# Patient Record
Sex: Male | Born: 1975 | Race: White | Hispanic: No | Smoking: Former smoker
Health system: Southern US, Community
[De-identification: ages and names within clinical notes are randomized; demographics above are authoritative.]

## PROBLEM LIST (undated history)

## (undated) DIAGNOSIS — R3911 Hesitancy of micturition: Secondary | ICD-10-CM

## (undated) DIAGNOSIS — F4310 Post-traumatic stress disorder, unspecified: Secondary | ICD-10-CM

## (undated) DIAGNOSIS — S43006A Unspecified dislocation of unspecified shoulder joint, initial encounter: Secondary | ICD-10-CM

## (undated) HISTORY — PX: APPENDECTOMY: SHX54

## (undated) HISTORY — PX: THUMB ARTHROSCOPY: SHX2509

## (undated) HISTORY — PX: TONSILLECTOMY: SUR1361

## (undated) HISTORY — PX: SHOULDER SURGERY: SHX246

---

## 2014-10-06 ENCOUNTER — Emergency Department (HOSPITAL_COMMUNITY)

## 2014-10-06 ENCOUNTER — Encounter (HOSPITAL_COMMUNITY): Payer: Self-pay | Admitting: Emergency Medicine

## 2014-10-06 ENCOUNTER — Emergency Department (HOSPITAL_COMMUNITY)
Admission: EM | Admit: 2014-10-06 | Discharge: 2014-10-07 | Disposition: A | Discharge status: Home / Self Care | Attending: Emergency Medicine | Admitting: Emergency Medicine

## 2014-10-06 DIAGNOSIS — Y9289 Other specified places as the place of occurrence of the external cause: Secondary | ICD-10-CM | POA: Diagnosis not present

## 2014-10-06 DIAGNOSIS — Z87891 Personal history of nicotine dependence: Secondary | ICD-10-CM | POA: Insufficient documentation

## 2014-10-06 DIAGNOSIS — Y998 Other external cause status: Secondary | ICD-10-CM | POA: Insufficient documentation

## 2014-10-06 DIAGNOSIS — Z91040 Latex allergy status: Secondary | ICD-10-CM | POA: Insufficient documentation

## 2014-10-06 DIAGNOSIS — S43004A Unspecified dislocation of right shoulder joint, initial encounter: Secondary | ICD-10-CM

## 2014-10-06 DIAGNOSIS — Y9389 Activity, other specified: Secondary | ICD-10-CM | POA: Diagnosis not present

## 2014-10-06 DIAGNOSIS — X58XXXA Exposure to other specified factors, initial encounter: Secondary | ICD-10-CM | POA: Insufficient documentation

## 2014-10-06 HISTORY — DX: Unspecified dislocation of unspecified shoulder joint, initial encounter: S43.006A

## 2014-10-06 MED ORDER — METHOCARBAMOL 500 MG PO TABS
1000.0000 mg | ORAL_TABLET | Freq: Once | ORAL | Status: DC
  Filled 2014-10-06: qty 2

## 2014-10-06 MED ORDER — ONDANSETRON HCL 4 MG/2ML IJ SOLN
4.0000 mg | Freq: Once | INTRAMUSCULAR | Status: AC
  Administered 2014-10-06: 4 mg via INTRAVENOUS
  Filled 2014-10-06: qty 2

## 2014-10-06 MED ORDER — HYDROCODONE-ACETAMINOPHEN 5-325 MG PO TABS
2.0000 | ORAL_TABLET | Freq: Once | ORAL | Status: DC
  Filled 2014-10-06: qty 2

## 2014-10-06 MED ORDER — KETAMINE HCL 10 MG/ML IJ SOLN
1.0000 mg/kg | Freq: Once | INTRAMUSCULAR | Status: DC
Start: 2014-10-06 — End: 2014-10-06
  Filled 2014-10-06: qty 1

## 2014-10-06 MED ORDER — LORAZEPAM 2 MG/ML IJ SOLN
1.0000 mg | Freq: Once | INTRAMUSCULAR | Status: AC
  Administered 2014-10-06: 1 mg via INTRAVENOUS

## 2014-10-06 MED ORDER — LORAZEPAM 2 MG/ML IJ SOLN
INTRAMUSCULAR | Status: AC
  Filled 2014-10-06: qty 1

## 2014-10-06 MED ORDER — HYDROMORPHONE HCL 1 MG/ML IJ SOLN
1.0000 mg | Freq: Once | INTRAMUSCULAR | Status: AC
  Administered 2014-10-06: 1 mg via INTRAVENOUS
  Filled 2014-10-06: qty 1

## 2014-10-06 NOTE — ED Notes (Signed)
Patient reports "my shoulder is out of place." states he went to push himself up and he felt shoulder right shoulder pop out. Reports history of right shoulder surgery and shoulder dislocation.

## 2014-10-06 NOTE — ED Provider Notes (Signed)
CSN: [id number]     Arrival date & time 10/06/14  1916 History   First MD Initiated Contact with Patient 10/06/14 2049     Chief Complaint  Patient presents with  . Shoulder Pain     (Consider location/radiation/quality/duration/timing/severity/associated sxs/prior Treatment) Patient is a 39 y.o. male presenting with shoulder pain. The history is provided by the patient.  Shoulder Pain Location:  Shoulder Time since incident: just prior to ED admission. Upper extremity injury: Hx of shoulder surgery. Pushed back on the bed and felt a pop as if shoulder came out of place.   Shoulder location:  R shoulder Pain details:    Quality:  Throbbing   Severity:  Severe   Onset quality:  Sudden   Timing:  Constant   Progression:  Worsening Chronicity:  Chronic Dislocation: yes   Relieved by:  Nothing Worsened by:  Movement Risk factors comment:  Similar situations  in the past.   Past Medical History  Diagnosis Date  . Shoulder dislocation    Past Surgical History  Procedure Laterality Date  . Shoulder surgery Right    History reviewed. No pertinent family history. History  Substance Use Topics  . Smoking status: Former Games developer  . Smokeless tobacco: Not on file  . Alcohol Use: No    Review of Systems  Musculoskeletal: Positive for arthralgias.  All other systems reviewed and are negative.     Allergies  Propofol; Latex; and Versed  Home Medications   Prior to Admission medications   Medication Sig Start Date End Date Taking? Authorizing Provider  ibuprofen (ADVIL,MOTRIN) 200 MG tablet Take 200 mg by mouth every 6 (six) hours as needed for mild pain or moderate pain.   Yes Historical Provider, MD   BP 124/79 mmHg  Pulse 74  Temp(Src) 97.8 F (36.6 C) (Oral)  Resp 20  Ht  (1.702 m)  Wt 166 lb (75.297 kg)  BMI 25.99 kg/m2  SpO2 97% Physical Exam  Constitutional: He is oriented to person, place, and time. He appears well-developed and well-nourished.   Non-toxic appearance.  HENT:  Head: Normocephalic.  Right Ear: Tympanic membrane and external ear normal.  Left Ear: Tympanic membrane and external ear normal.  Eyes: EOM and lids are normal. Pupils are equal, round, and reactive to light.  Neck: Normal range of motion. Neck supple. Carotid bruit is not present.  Cardiovascular: Normal rate, regular rhythm, normal heart sounds, intact distal pulses and normal pulses.   Pulmonary/Chest: Breath sounds normal. No respiratory distress.  Abdominal: Soft. Bowel sounds are normal. There is no tenderness. There is no guarding.  Musculoskeletal: Normal range of motion. He exhibits tenderness.  There is deformity consistent with dislocation of the right shoulder. Brachial and radial pulse 2+. FROM of the right wrist and fingers.  Lymphadenopathy:       Head (right side): No submandibular adenopathy present.       Head (left side): No submandibular adenopathy present.    He has no cervical adenopathy.  Neurological: He is alert and oriented to person, place, and time. He has normal strength. No cranial nerve deficit or sensory deficit.  Skin: Skin is warm and dry.  Psychiatric: He has a normal mood and affect. His speech is normal.  Nursing note and vitals reviewed.   ED Cours  Pt seen with me by Dr Manus Gunning.  Reduction of dislocation by Dr Manus Gunning.  Procedures (including critical care time) Labs Review Labs Reviewed - No data to display  Imaging Review  Dg Shoulder Right  10/06/2014   CLINICAL DATA:  Injured right shoulder tonight. History of prior dislocations.  EXAM: RIGHT SHOULDER - 2+ VIEW  COMPARISON:  None.  FINDINGS: There are surgical changes involving the glenoid. The [facility] joint and glenohumeral joints are maintained. No fracture or dislocation. The scapular Y-views are over rotated and are not diagnostic. The ribs are grossly normal.  IMPRESSION: Limited examination because of over rotated scapular Y-views. No evidence of dislocation on  the AP view.   Electronically Signed   By: Rudie Meyer M.D.   On: 10/06/2014 20:47     EKG Interpretation None      MDM Initial evaluation raised concerns about dislocation. Shoulder reduced, but came back out. Shoulder reduced a 2nd time. Xray reveal a better position. No fx, and possible chronic glenoid defect. Pt placed in sling. He will see orthopedic MD at [facility] in [place]. Pt released to prison officer.   Final diagnoses:  Shoulder dislocation, right, initial encounter    **I have reviewed nursing notes, vital signs, and all appropriate lab and imaging results for this patient.Ivery Quale, PA-C 10/07/14 0009  Glynn Octave, MD 10/07/14 [phone]

## 2014-10-06 NOTE — Discharge Instructions (Signed)
Your repeat x-ray reveals better positioning of your shoulder. It also shows some chronic changes of your ball and socket involving the right shoulder. It is important that you see the orthopedist to dig her last surgery, or the orthopedist of your choice for additional evaluation and management of this issue. Please use the sling for the next 7 days. Shoulder Dislocation  Shoulder dislocation is when your upper arm bone (humerus) is forced out of your shoulder joint. Your doctor will put your shoulder back into the joint by pulling on your arm or through surgery. Your arm will be placed in a shoulder immobilizer or sling. The shoulder immobilizer or sling holds your shoulder in place while it heals. HOME CARE   Rest your injured joint. Do not move it until instructed to do so.  Put ice on your injured joint as told by your doctor.  Put ice in a plastic bag.  Place a towel between your skin and the bag.  Leave the ice on for 15-20 minutes at a time, every 2 hours while you are awake.  Only take medicines as told by your doctor.  Squeeze a ball to exercise your hand. GET HELP RIGHT AWAY IF:   Your splint or sling becomes damaged.  Your pain becomes worse, not better.  You lose feeling in your arm or hand.  Your arm or hand becomes white or cold. MAKE SURE YOU:   Understand these instructions.  Will watch your condition.  Will get help right away if you are not doing well or get worse. Document Released: 05/10/2011 Document Reviewed: 05/10/2011 [facility] Patient Information 2015 [place], [place]. This information is not intended to replace advice given to you by your health care provider. Make sure you discuss any questions you have with your health care provider.

## 2014-10-07 NOTE — ED Provider Notes (Addendum)
Reduction of dislocation Date/Time: 21:00 Performed by: Glynn Octave Authorized by: Glynn Octave Consent: Verbal consent obtained. Risks and benefits: risks, benefits and alternatives were discussed Consent given by: patient Required items: required blood products, implants, devices, and special equipment available Time out: Immediately prior to procedure a "time out" was called to verify the correct patient, procedure, equipment, support staff and site/side marked as required.  Patient sedated: no.  Anxiolysis with dilaudid and ativan.  Refuses ketamine.   Vitals: Vital signs were monitored during sedation. Patient tolerance: Patient tolerated the procedure well with no immediate complications. Joint: R shoulder Reduction technique: external rotation, abduction, manipulation of humeral head    Glynn Octave, MD 10/07/14 [id number]  Glynn Octave, MD 10/07/14 2952

## 2014-10-07 NOTE — ED Notes (Signed)
Patient and officer verbalize understanding of discharge instructions, home care and follow up care. Patient ambulatory out of department at this time in police custody to return to jail

## 2014-11-13 ENCOUNTER — Emergency Department (HOSPITAL_COMMUNITY)
Admission: EM | Admit: 2014-11-13 | Discharge: 2014-11-14 | Disposition: A | Discharge status: Home / Self Care | Attending: Emergency Medicine | Admitting: Emergency Medicine

## 2014-11-13 ENCOUNTER — Encounter (HOSPITAL_COMMUNITY): Payer: Self-pay | Admitting: Emergency Medicine

## 2014-11-13 DIAGNOSIS — M7981 Nontraumatic hematoma of soft tissue: Secondary | ICD-10-CM | POA: Diagnosis not present

## 2014-11-13 DIAGNOSIS — Z9889 Other specified postprocedural states: Secondary | ICD-10-CM | POA: Insufficient documentation

## 2014-11-13 DIAGNOSIS — R51 Headache: Secondary | ICD-10-CM | POA: Diagnosis not present

## 2014-11-13 DIAGNOSIS — R55 Syncope and collapse: Secondary | ICD-10-CM | POA: Diagnosis not present

## 2014-11-13 DIAGNOSIS — R200 Anesthesia of skin: Secondary | ICD-10-CM | POA: Insufficient documentation

## 2014-11-13 DIAGNOSIS — Z91040 Latex allergy status: Secondary | ICD-10-CM | POA: Insufficient documentation

## 2014-11-13 DIAGNOSIS — M24411 Recurrent dislocation, right shoulder: Secondary | ICD-10-CM | POA: Insufficient documentation

## 2014-11-13 DIAGNOSIS — M25511 Pain in right shoulder: Secondary | ICD-10-CM | POA: Diagnosis present

## 2014-11-13 MED ORDER — LIDOCAINE HCL (PF) 1 % IJ SOLN
30.0000 mL | Freq: Once | INTRAMUSCULAR | Status: DC
  Filled 2014-11-13: qty 30

## 2014-11-13 NOTE — ED Notes (Addendum)
Pt states he heard a loud pop in right shoulder and then has syncopal episode with head striking a wall.

## 2014-11-13 NOTE — ED Notes (Signed)
Patient's handcuff and leg restraints removed, Public relations account executive at bedside.

## 2014-11-13 NOTE — ED Provider Notes (Signed)
CSN: [id number]     Arrival date & time 11/13/14  2324 History  his chart was scribed for Shon Baton, MD by Murriel Hopper, ED Scribe. This patient was seen in room APA03/[facility] and the patient's care was started at 11:54 PM.  Chief Complaint  Patient presents with  . Loss of Consciousness      The history is provided by the patient. No language interpreter was used.   HPI Comments: Paul Dawson is a 39 y.o. male who presents to the Emergency Department complaining of constant, worsening 8/10 right shoulder pain with associated numbness, syncopal episode and headache that has been present since earlier today. Pt states he heard his shoulder pop and then passed out. He states the next thing he remembers were correctional officers picking him up from off the ground. Pt states he hit his head on the wall when he had syncopal episode and now complains of a generalized headache and nausea. No vomiting.  Pt states he has dislocated the same shoulder twice in the past 3 months, and has a hx of 3 previous shoulder surgeries. History of recurrent shoulder dislocations. Pt states he has numbness radiating down the underside of his right forearm into his palm. Pt states he has taken Ibuprofen to treat his pain with little relief. Pt denies neck pain, and states his last tetanus shot was 2.5 years ago.   Of note, per correctional officers, patient frequently "throws shoulder out."    Past Medical History  Diagnosis Date  . Shoulder dislocation    Past Surgical History  Procedure Laterality Date  . Shoulder surgery Right   . Appendectomy    . Tonsillectomy    . Thumb arthroscopy     No family history on file. Social History  Substance Use Topics  . Smoking status: Former Games developer  . Smokeless tobacco: None  . Alcohol Use: No    Review of Systems  Constitutional: Negative.  Negative for fever.  Respiratory: Negative.  Negative for chest tightness and shortness of breath.    Cardiovascular: Negative.  Negative for chest pain.  Gastrointestinal: Negative.   Genitourinary: Negative.  Negative for dysuria.  Musculoskeletal: Negative for back pain.       Shoulder pain  Neurological: Positive for syncope, numbness and headaches. Negative for dizziness, weakness and light-headedness.  All other systems reviewed and are negative.     Allergies  Propofol; Latex; and Versed  Home Medications   Prior to Admission medications   Medication Sig Start Date End Date Taking? Authorizing Provider  ibuprofen (ADVIL,MOTRIN) 200 MG tablet Take 200 mg by mouth every 6 (six) hours as needed for mild pain or moderate pain.    Historical Provider, MD   BP 110/85 mmHg  Pulse 68  Temp(Src) 97.5 F (36.4 C) (Oral)  Resp 20  Ht 5\' 7"  (1.702 m)  Wt 160 lb (72.576 kg)  BMI 25.05 kg/m2  SpO2 99% Physical Exam  Constitutional: He is oriented to person, place, and time. He appears well-developed and well-nourished. No distress.  HENT:  Head: Normocephalic.  2 cm hematoma noted over the left parietal scalp, small overlying abrasion  Eyes: Pupils are equal, round, and reactive to light.  Neck: Normal range of motion. Neck supple.  No midline step-off or deformity  Cardiovascular: Normal rate, regular rhythm and normal heart sounds.   No murmur heard. Pulmonary/Chest: Effort normal and breath sounds normal. No respiratory distress. He has no wheezes.  Abdominal: Soft. Bowel sounds are normal.  There is no tenderness. There is no rebound.  Musculoskeletal: He exhibits no edema.  Limited range of motion, shoulder appears anterior dislocated with taken glenoid fossa, neurovascularly intact distally with a 2+ radial pulse and intact median, ulnar, and radial nerves  Lymphadenopathy:    He has no cervical adenopathy.  Neurological: He is alert and oriented to person, place, and time.  Skin: Skin is warm and dry.  Psychiatric: He has a normal mood and affect.  Nursing note and  vitals reviewed.   ED Course  Procedures (including critical care time)  Reduction of dislocation Date/Time: 2:28 AM Performed by: Shon Baton Authorized by: Shon Baton Consent: Verbal consent obtained. Risks and benefits: risks, benefits and alternatives were discussed Consent given by: patient Required items: required blood products, implants, devices, and special equipment available Time out: Immediately prior to procedure a "time out" was called to verify the correct patient, procedure, equipment, support staff and site/side marked as required.  Patient sedated: no; given morphine for anxiolysis and Ativan for possible subluxation  Vitals: Vital signs were monitored during sedation. Patient tolerance: Patient tolerated the procedure well with no immediate complications. Joint: Shoulder Reduction technique: External rotation    DIAGNOSTIC STUDIES: Oxygen Saturation is 100% on room air, normal by my interpretation.    COORDINATION OF CARE: 12:01 AM Discussed treatment plan with pt at bedside and pt agreed to plan.   Labs Review Labs Reviewed - No data to display  Imaging Review Dg Shoulder Right  11/14/2014   CLINICAL DATA:  Postreduction.  EXAM: RIGHT SHOULDER - 2+ VIEW  COMPARISON:  Earlier this day.  Radiographs 10/06/2014  FINDINGS: Humeral head normally persistent over the glenoid on the transscapular Y-view. Two surgical screws and single anchor seen in the inferior glenoid. Some adjacent fragmentation appears chronic. Acromioclavicular joint is maintained.  IMPRESSION: Normal glenohumeral alignment, best appreciated on transscapular Y-view. Postsurgical change of the inferior glenoid with adjacent chronic fragmentation.   Electronically Signed   By: Rubye Oaks M.D.   On: 11/14/2014 02:10   Dg Shoulder Right  11/14/2014   CLINICAL DATA:  39 year old male with fall and right shoulder injury.  EXAM: RIGHT SHOULDER - 2+ VIEW  COMPARISON:  Radiograph  dated 10/06/2014  FINDINGS: No acute fracture or dislocation. There is stable postoperative changes of the glenoid with fixation screws. The soft tissues are grossly unremarkable.  IMPRESSION: No acute fracture or dislocation.   Electronically Signed   By: Elgie Collard M.D.   On: 11/14/2014 00:52   Ct Head Wo Contrast  11/14/2014   CLINICAL DATA:  39 year old male with fall and head injury  EXAM: CT HEAD WITHOUT CONTRAST  TECHNIQUE: Contiguous axial images were obtained from the base of the skull through the vertex without intravenous contrast.  COMPARISON:  None.  FINDINGS: The ventricles and the sulci are appropriate in size for the patient's age. There is no intracranial hemorrhage. No midline shift or mass effect identified. The gray-white matter differentiation is preserved.  The visualized paranasal sinuses and mastoid air cells are well aerated. The calvarium is intact.  IMPRESSION: No acute intracranial pathology.   Electronically Signed   By: Elgie Collard M.D.   On: 11/14/2014 00:56   I have personally reviewed and evaluated these images and lab results as part of my medical decision-making.   EKG Interpretation None      MDM   Final diagnoses:  Recurrent shoulder dislocation, right    Patient presents with what appears to  be a right shoulder dislocation. History of the same. Recurrent dislocations in the past. Also reports syncopal event secondary to pain. He sustained a minor head injury. He has evidence of small hematoma and abrasion to the forehead and reports nausea. For this reason CT head was obtained. Plain films of the shoulder showed that the shoulder appears in place. However, clinically, the humeral head is anteriorly located with vacant fossa. Suspect subluxation versus dislocation.  Shoulder was reduced at the bedside with clinical improvement of exam. Patient's neurovascular exam remained normal postreduction. Repeat films reassuring. Patient placed in a sling.  Patient to follow-up with his orthopedist as soon as possible CT head negative.   After history, exam, and medical workup I feel the patient has been appropriately medically screened and is safe for discharge home. Pertinent diagnoses were discussed with the patient. Patient was given return precautions.  I personally performed the services described in this documentation, which was scribed in my presence. The recorded information has been reviewed and is accurate.    Shon Baton, MD 11/14/14 [phone]

## 2014-11-14 ENCOUNTER — Emergency Department (HOSPITAL_COMMUNITY)

## 2014-11-14 MED ORDER — LORAZEPAM 2 MG/ML IJ SOLN
1.0000 mg | Freq: Once | INTRAMUSCULAR | Status: AC
  Administered 2014-11-14: 1 mg via INTRAVENOUS
  Filled 2014-11-14: qty 1

## 2014-11-14 MED ORDER — BACITRACIN ZINC 500 UNIT/GM EX OINT
TOPICAL_OINTMENT | CUTANEOUS | Status: AC
  Filled 2014-11-14: qty 0.9

## 2014-11-14 MED ORDER — LIDOCAINE HCL (PF) 2 % IJ SOLN
INTRAMUSCULAR | Status: AC
  Filled 2014-11-14: qty 10

## 2014-11-14 MED ORDER — MORPHINE SULFATE (PF) 4 MG/ML IV SOLN
4.0000 mg | Freq: Once | INTRAVENOUS | Status: AC
  Administered 2014-11-14: 4 mg via INTRAVENOUS
  Filled 2014-11-14: qty 1

## 2014-11-14 NOTE — ED Notes (Signed)
Gave report to Physician in [place], [place] and he stated it was okay to sent to discharge patient.  Patient placed in right shoulder sling, vital signs stable, in custody of Officer Staples, who has discharge paperwork.

## 2014-11-14 NOTE — Discharge Instructions (Signed)
You were seen today for what was likely a shoulder subluxation. You have a history of recurrent shoulder dislocations. You need to follow-up closely with your orthopedist. Maintain splint until follow-up.  Shoulder Dislocation Your shoulder is made up of three bones: the collar bone (clavicle); the shoulder blade (scapula), which includes the socket (glenoid cavity); and the upper arm bone (humerus). Your shoulder joint is the place where these bones meet. Strong, fibrous tissues hold these bones together (ligaments). Muscles and strong, fibrous tissues that connect the muscles to these bones (tendons) allow your arm to move through this joint. The range of motion of your shoulder joint is more extensive than most of your other joints, and the glenoid cavity is very shallow. That is the reason that your shoulder joint is one of the most unstable joints in your body. It is far more prone to dislocation than your other joints. Shoulder dislocation is when your humerus is forced out of your shoulder joint. CAUSES Shoulder dislocation is caused by a forceful impact on your shoulder. This impact usually is from an injury, such as a sports injury or a fall. SYMPTOMS Symptoms of shoulder dislocation include:  Deformity of your shoulder.  Intense pain.  Inability to move your shoulder joint.  Numbness, weakness, or tingling around your shoulder joint (your neck or down your arm).  Bruising or swelling around your shoulder. DIAGNOSIS In order to diagnose a dislocated shoulder, your caregiver will perform a physical exam. Your caregiver also may have an X-ray exam done to see if you have any broken bones. Magnetic resonance imaging (MRI) is a procedure that sometimes is done to help your caregiver see any damage to the soft tissues around your shoulder, particularly your rotator cuff tendons. Additionally, your caregiver also may have electromyography done to measure the electrical discharges produced in  your muscles if you have signs or symptoms of nerve damage. TREATMENT A shoulder dislocation is treated by placing the humerus back in the joint (reduction). Your caregiver does this either manually (closed reduction), by moving your humerus back into the joint through manipulation, or through surgery (open reduction). When your humerus is back in place, severe pain should improve almost immediately. You also may need to have surgery if you have a weak shoulder joint or ligaments, and you have recurring shoulder dislocations, despite rehabilitation. In rare cases, surgery is necessary if your nerves or blood vessels are damaged during the dislocation. After your reduction, your arm will be placed in a shoulder immobilizer or sling to keep it from moving. Your caregiver will have you wear your shoulder immobilizer or sling for 3 days to 3 weeks, depending on how serious your dislocation is. When your shoulder immobilizer or sling is removed, your caregiver may prescribe physical therapy to help improve the range of motion in your shoulder joint. HOME CARE INSTRUCTIONS  The following measures can help to reduce pain and speed up the healing process:  Rest your injured joint. Do not move it. Avoid activities similar to the one that caused your injury.  Apply ice to your injured joint for the first day or two after your reduction or as directed by your caregiver. Applying ice helps to reduce inflammation and pain.  Put ice in a plastic bag.  Place a towel between your skin and the bag.  Leave the ice on for 15-20 minutes at a time, every 2 hours while you are awake.  Exercise your hand by squeezing a soft ball. This helps to  eliminate stiffness and swelling in your hand and wrist.  Take over-the-counter or prescription medicine for pain or discomfort as told by your caregiver. SEEK IMMEDIATE MEDICAL CARE IF:   Your shoulder immobilizer or sling becomes damaged.  Your pain becomes worse rather  than better.  You lose feeling in your arm or hand, or they become white and cold. MAKE SURE YOU:   Understand these instructions.  Will watch your condition.  Will get help right away if you are not doing well or get worse. Document Released: 11/10/2000 Document Revised: 07/02/2013 Document Reviewed: 12/06/2010 [facility] Patient Information 2015 [place], [place]. This information is not intended to replace advice given to you by your health care provider. Make sure you discuss any questions you have with your health care provider.

## 2015-04-09 ENCOUNTER — Encounter (HOSPITAL_COMMUNITY): Payer: Self-pay | Admitting: *Deleted

## 2015-04-09 ENCOUNTER — Emergency Department (HOSPITAL_COMMUNITY)

## 2015-04-09 ENCOUNTER — Emergency Department (HOSPITAL_COMMUNITY)
Admission: EM | Admit: 2015-04-09 | Discharge: 2015-04-09 | Disposition: A | Discharge status: Home / Self Care | Attending: Emergency Medicine | Admitting: Emergency Medicine

## 2015-04-09 DIAGNOSIS — S0993XA Unspecified injury of face, initial encounter: Secondary | ICD-10-CM | POA: Diagnosis not present

## 2015-04-09 DIAGNOSIS — S43004A Unspecified dislocation of right shoulder joint, initial encounter: Secondary | ICD-10-CM | POA: Insufficient documentation

## 2015-04-09 DIAGNOSIS — W2209XA Striking against other stationary object, initial encounter: Secondary | ICD-10-CM | POA: Insufficient documentation

## 2015-04-09 DIAGNOSIS — Y9389 Activity, other specified: Secondary | ICD-10-CM | POA: Insufficient documentation

## 2015-04-09 DIAGNOSIS — Y9289 Other specified places as the place of occurrence of the external cause: Secondary | ICD-10-CM | POA: Insufficient documentation

## 2015-04-09 DIAGNOSIS — S4991XA Unspecified injury of right shoulder and upper arm, initial encounter: Secondary | ICD-10-CM | POA: Diagnosis present

## 2015-04-09 DIAGNOSIS — Z91040 Latex allergy status: Secondary | ICD-10-CM | POA: Diagnosis not present

## 2015-04-09 DIAGNOSIS — Z87891 Personal history of nicotine dependence: Secondary | ICD-10-CM | POA: Insufficient documentation

## 2015-04-09 DIAGNOSIS — Y998 Other external cause status: Secondary | ICD-10-CM | POA: Diagnosis not present

## 2015-04-09 HISTORY — DX: Hesitancy of micturition: R39.11

## 2015-04-09 MED ORDER — HYDROMORPHONE HCL 1 MG/ML IJ SOLN
INTRAMUSCULAR | Status: DC
Start: 2015-04-09 — End: 2015-04-09
  Filled 2015-04-09: qty 1

## 2015-04-09 MED ORDER — LORAZEPAM 2 MG/ML IJ SOLN
1.0000 mg | Freq: Once | INTRAMUSCULAR | Status: AC
  Administered 2015-04-09: 1 mg via INTRAVENOUS
  Filled 2015-04-09: qty 1

## 2015-04-09 MED ORDER — HYDROCODONE-ACETAMINOPHEN 5-325 MG PO TABS: 1.0000 | ORAL_TABLET | Freq: Four times a day (QID) | ORAL | Status: DC | PRN

## 2015-04-09 MED ORDER — SODIUM CHLORIDE 0.9 % IV SOLN
INTRAVENOUS | Status: DC
  Administered 2015-04-09: 1000 mL via INTRAVENOUS

## 2015-04-09 MED ORDER — IBUPROFEN 800 MG PO TABS: 800.0000 mg | ORAL_TABLET | Freq: Three times a day (TID) | ORAL | Status: AC | PRN

## 2015-04-09 MED ORDER — HYDROMORPHONE HCL 1 MG/ML IJ SOLN
1.0000 mg | Freq: Once | INTRAMUSCULAR | Status: AC
  Administered 2015-04-09: 1 mg via INTRAVENOUS

## 2015-04-09 MED ORDER — HYDROMORPHONE HCL 1 MG/ML IJ SOLN
1.0000 mg | Freq: Once | INTRAMUSCULAR | Status: AC
  Administered 2015-04-09: 1 mg via INTRAVENOUS
  Filled 2015-04-09: qty 1

## 2015-04-09 NOTE — ED Notes (Signed)
Pt states he had his right arm twisted behind him tonight & thinks it is dislocated. Pt says he has a bad shoulder anyway. Pt has good pulses & cap refill.
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
## 2015-04-09 NOTE — Discharge Instructions (Signed)
How to Use a Shoulder Immobilizer °A shoulder immobilizer is a device that you may have to wear after a shoulder injury or surgery. This device keeps your arm from moving. This prevents additional pain or injury. It also supports your arm next to your body as your shoulder heals. °You may need to wear a shoulder immobilizer to treat a broken bone (fracture) in your shoulder. You may also need to wear one if you have an injury that moves your shoulder out of position (dislocation). There are different types of shoulder immobilizers. The one that you get depends on your injury. °RISKS AND COMPLICATIONS °Wearing a shoulder immobilizer in the wrong way can let your injured shoulder move around too much. This may delay healing and make your pain and swelling worse. °HOW TO USE YOUR SHOULDER IMMOBILIZER °· The part of the immobilizer that goes around your neck (sling) should support your upper arm, with your elbow bent and your lower arm and hand across your chest. °· Make sure that your elbow: °· Is snug against the back pocket of the sling. °· Does not move away from your body. °· The strap of the immobilizer should go over your shoulder and support your arm and hand. Your hand should be slightly higher than your elbow. It should not hang loosely over the edge of the sling. °· If the long strap has a pad, place it where it is most comfortable on your neck. °· Carefully follow your health care provider's instructions for wearing your shoulder immobilizer. Your health care provider may want you to: °· Loosen your immobilizer to straighten your elbow and move your wrist and fingers. You may have to do this several times each day. Ask your health care provider when you should do this and how often. °· Remove your immobilizer once every day to shower, but limit the movement in your injured arm. Before putting the immobilizer back on, use a towel to dry the area under your arm completely. °· Remove your immobilizer to do  shoulder exercises at home as directed by your health care provider. °· Wear your immobilizer while you sleep. You may sleep more comfortably if you have your upper body raised on pillows. °SEEK MEDICAL CARE IF: °· Your immobilizer is not supporting your arm properly. °· Your immobilizer gets damaged. °· You have worsening pain or swelling in your shoulder, arm, or hand. °· Your shoulder, arm, or hand changes color or temperature. °· You lose feeling in your shoulder, arm, or hand. °  °This information is not intended to replace advice given to you by your health care provider. Make sure you discuss any questions you have with your health care provider. °  °Document Released: 03/25/2004 Document Revised: 07/02/2014 Document Reviewed: 01/23/2014 °Elsevier Interactive Patient Education ©2016 Elsevier Inc. ° °Shoulder Dislocation °A shoulder dislocation happens when the upper arm bone (humerus) moves out of the shoulder joint. The shoulder joint is the part of the shoulder where the humerus, shoulder blade (scapula), and collarbone (clavicle) meet. °CAUSES °This condition is often caused by: °· A fall. °· A hit to the shoulder. °· A forceful movement of the shoulder. °RISK FACTORS °This condition is more likely to develop in people who play sports. °SYMPTOMS °Symptoms of this condition include: °· Deformity of the shoulder. °· Intense pain. °· Inability to move the shoulder. °· Numbness, weakness, or tingling in your neck or down your arm. °· Bruising or swelling around your shoulder. °DIAGNOSIS °This condition is diagnosed with   a physical exam. After the exam, tests may be done to check for related problems. Tests that may be done include: °· X-ray. This may be done to check for broken bones. °· MRI. This may be done to check for damage to the tissues around the shoulder. °· Electromyogram. This may be done to check for nerve damage. °TREATMENT °This condition is treated with a procedure to place the humerus back in  the joint. This procedure is called a reduction. There are two types of reduction: °· Closed reduction. In this procedure, the humerus is placed back in the joint without surgery. The health care provider uses his or her hands to guide the bone back into place. °· Open reduction. In this procedure, the humerus is placed back in the joint with surgery. An open reduction may be recommended if: °¨ You have a weak shoulder joint or weak ligaments. °¨ You have had more than one shoulder dislocation. °¨ The nerves or blood vessels around your shoulder have been damaged. °After the humerus is placed back into the joint, your arm will be placed in a splint or sling to prevent it from moving. You will need to wear the splint or sling until your shoulder heals. When the splint or sling is removed, you may have physical therapy to help improve the range of motion in your shoulder joint. °HOME CARE INSTRUCTIONS °If You Have a Splint or Sling: °· Wear it as told by your health care provider. Remove it only as told by your health care provider. °· Loosen it if your fingers become numb and tingle, or if they turn cold and blue. °· Keep it clean and dry. °Bathing °· Do not take baths, swim, or use a hot tub until your health care provider approves. Ask your health care provider if you can take showers. You may only be allowed to take sponge baths for bathing. °· If your health care provider approves bathing and showering, cover your splint or sling with a watertight plastic bag to protect it from water. Do not let the splint or sling get wet. °Managing Pain, Stiffness, and Swelling °· If directed, apply ice to the injured area. °¨ Put ice in a plastic bag. °¨ Place a towel between your skin and the bag. °¨ Leave the ice on for 20 minutes, 2-3 times per day. °· Move your fingers often to avoid stiffness and to decrease swelling. °· Raise (elevate) the injured area above the level of your heart while you are sitting or lying  down. °Driving °· Do not drive while wearing a splint or sling on a hand that you use for driving. °· Do not drive or operate heavy machinery while taking pain medicine. °Activity °· Return to your normal activities as told by your health care provider. Ask your health care provider what activities are safe for you. °· Perform range-of-motion exercises only as told by your health care provider. °· Exercise your hand by squeezing a soft ball. This helps to decrease stiffness and swelling in your hand and wrist. °General Instructions °· Take over-the-counter and prescription medicines only as told by your health care provider. °· Do not use any tobacco products, including cigarettes, chewing tobacco, or e-cigarettes. Tobacco can delay bone and tissue healing. If you need help quitting, ask your health care provider. °· Keep all follow-up visits as told by your health care provider. This is important. °SEEK MEDICAL CARE IF: °· Your splint or sling gets damaged. °SEEK IMMEDIATE MEDICAL CARE   IF: °· Your pain gets worse rather than better. °· You lose feeling in your arm or hand. °· Your arm or hand becomes white and cold. °  °This information is not intended to replace advice given to you by your health care provider. Make sure you discuss any questions you have with your health care provider. °  °Document Released: 11/10/2000 Document Revised: 11/06/2014 Document Reviewed: 06/10/2014 °Elsevier Interactive Patient Education ©2016 Elsevier Inc. ° °
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
## 2015-06-01 ENCOUNTER — Emergency Department (HOSPITAL_COMMUNITY)
Admission: EM | Admit: 2015-06-01 | Discharge: 2015-06-02 | Disposition: A | Discharge status: Home / Self Care | Attending: Emergency Medicine | Admitting: Emergency Medicine

## 2015-06-01 ENCOUNTER — Emergency Department (HOSPITAL_COMMUNITY)

## 2015-06-01 ENCOUNTER — Encounter (HOSPITAL_COMMUNITY): Payer: Self-pay | Admitting: *Deleted

## 2015-06-01 DIAGNOSIS — Y999 Unspecified external cause status: Secondary | ICD-10-CM | POA: Insufficient documentation

## 2015-06-01 DIAGNOSIS — S43011A Anterior subluxation of right humerus, initial encounter: Secondary | ICD-10-CM | POA: Diagnosis not present

## 2015-06-01 DIAGNOSIS — Y929 Unspecified place or not applicable: Secondary | ICD-10-CM | POA: Diagnosis not present

## 2015-06-01 DIAGNOSIS — Y939 Activity, unspecified: Secondary | ICD-10-CM | POA: Insufficient documentation

## 2015-06-01 DIAGNOSIS — Z791 Long term (current) use of non-steroidal anti-inflammatories (NSAID): Secondary | ICD-10-CM | POA: Insufficient documentation

## 2015-06-01 DIAGNOSIS — S4991XA Unspecified injury of right shoulder and upper arm, initial encounter: Secondary | ICD-10-CM | POA: Diagnosis present

## 2015-06-01 DIAGNOSIS — Z87891 Personal history of nicotine dependence: Secondary | ICD-10-CM | POA: Diagnosis not present

## 2015-06-01 DIAGNOSIS — X58XXXA Exposure to other specified factors, initial encounter: Secondary | ICD-10-CM | POA: Insufficient documentation

## 2015-06-01 DIAGNOSIS — S43001A Unspecified subluxation of right shoulder joint, initial encounter: Secondary | ICD-10-CM

## 2015-06-01 HISTORY — DX: Post-traumatic stress disorder, unspecified: F43.10

## 2015-06-01 MED ORDER — HYDROMORPHONE HCL 1 MG/ML IJ SOLN
1.0000 mg | Freq: Once | INTRAMUSCULAR | Status: AC
  Administered 2015-06-01: 1 mg via INTRAVENOUS
  Filled 2015-06-01: qty 1

## 2015-06-01 MED ORDER — ONDANSETRON HCL 4 MG/2ML IJ SOLN
4.0000 mg | Freq: Once | INTRAMUSCULAR | Status: AC
  Administered 2015-06-01: 4 mg via INTRAVENOUS
  Filled 2015-06-01: qty 2

## 2015-06-01 MED ORDER — DIAZEPAM 5 MG/ML IJ SOLN
2.5000 mg | Freq: Once | INTRAMUSCULAR | Status: AC
  Administered 2015-06-01: 2.5 mg via INTRAVENOUS
  Filled 2015-06-01: qty 2

## 2015-06-01 NOTE — ED Notes (Signed)
[facility] He states he went to lay down in the bed and he reached back with his right arm. Pt states he has multiple dislocations and 3 surgeries to this shoulder. Pt concerned shoulder is dislocated.

## 2015-06-01 NOTE — ED Notes (Signed)
   06/01/15 2217  Musculoskeletal  Musculoskeletal (WDL) X  RUE Limited movement  Pt with pain to right shoulder. Hx of multiply dislocations. Good sensation & pulses present.

## 2015-06-02 ENCOUNTER — Emergency Department (HOSPITAL_COMMUNITY)

## 2015-06-02 MED ORDER — HYDROMORPHONE HCL 1 MG/ML IJ SOLN
1.0000 mg | Freq: Once | INTRAMUSCULAR | Status: DC
  Filled 2015-06-02: qty 1

## 2015-06-02 NOTE — Discharge Instructions (Signed)
Shoulder Dislocation Your shoulder joint is made up of 3 bones:  The upper arm bone (humerus).  The shoulder blade (scapula).  The collarbone (clavicle). A shoulder dislocation happens when your upper arm bone moves out of its normal place in your shoulder joint. HOME CARE If You Have a Splint or Sling:  Wear it as told by your doctor.  Take it off only as told by your doctor.  Loosen it if:  Your fingers become numb and tingly.  Your fingers turn cold and blue.  Keep it clean and dry. Bathing  Do not take baths, swim, or use a hot tub until your doctor says you can. Ask your doctor if you can take showers. You may only be allowed to take sponge baths.  If your doctor says taking baths or showers is okay, cover your splint or sling with a plastic bag. Do not let the splint or sling get wet. Managing Pain, Stiffness, and Swelling  If told, put ice on the injured area.  Put ice in a plastic bag.  Place a towel between your skin and the bag.  Leave the ice on for 20 minutes, 2-3 times per day.  Move your fingers often to avoid stiffness and to lessen swelling.  Raise (elevate) the injured area above the level of your heart while you are sitting or lying down. Driving  Do not drive while you are wearing a splint or sling on a hand that you use for driving.  Do not drive or operate heavy machinery while taking pain medicine. Activity  Return to your normal activities as told by your doctor. Ask your doctor what activities are safe for you.  Do range-of-motion exercises only as told by your doctor.  Exercise your hand by squeezing a soft ball. This keeps your hand and wrist from getting stiff and swollen. General Instructions  Take over-the-counter and prescription medicines only as told by your doctor.  Do not use any tobacco products, including cigarettes, chewing tobacco, or e-cigarettes. Tobacco can slow down healing. If you need help quitting, ask your  doctor.  Keep all follow-up visits as told by your doctor. This is important. GET HELP IF:  Your splint or sling gets damaged. GET HELP RIGHT AWAY IF:  Your pain gets worse instead of better.  You lose feeling in your arm or hand.  Your arm or hand turns white and cold.   This information is not intended to replace advice given to you by your health care provider. Make sure you discuss any questions you have with your health care provider.   Document Released: 05/10/2011 Document Revised: 11/06/2014 Document Reviewed: 06/10/2014 Elsevier Interactive Patient Education 2016 [employer].  How to Use a Shoulder Immobilizer A shoulder immobilizer is a device that you may have to wear after a shoulder injury or surgery. This device keeps your arm from moving. This prevents additional pain or injury. It also supports your arm next to your body as your shoulder heals. You may need to wear a shoulder immobilizer to treat a broken bone (fracture) in your shoulder. You may also need to wear one if you have an injury that moves your shoulder out of position (dislocation). There are different types of shoulder immobilizers. The one that you get depends on your injury. RISKS AND COMPLICATIONS Wearing a shoulder immobilizer in the wrong way can let your injured shoulder move around too much. This may delay healing and make your pain and swelling worse. HOW TO USE  YOUR SHOULDER IMMOBILIZER °· The part of the immobilizer that goes around your neck (sling) should support your upper arm, with your elbow bent and your lower arm and hand across your chest. °· Make sure that your elbow: °¨ Is snug against the back pocket of the sling. °¨ Does not move away from your body. °· The strap of the immobilizer should go over your shoulder and support your arm and hand. Your hand should be slightly higher than your elbow. It should not hang loosely over the edge of the sling. °· If the long strap has a pad, place it  where it is most comfortable on your neck. °· Carefully follow your health care provider's instructions for wearing your shoulder immobilizer. Your health care provider may want you to: °¨ Loosen your immobilizer to straighten your elbow and move your wrist and fingers. You may have to do this several times each day. Ask your health care provider when you should do this and how often. °¨ Remove your immobilizer once every day to shower, but limit the movement in your injured arm. Before putting the immobilizer back on, use a towel to dry the area under your arm completely. °¨ Remove your immobilizer to do shoulder exercises at home as directed by your health care provider. °¨ Wear your immobilizer while you sleep. You may sleep more comfortably if you have your upper body raised on pillows. °SEEK MEDICAL CARE IF: °· Your immobilizer is not supporting your arm properly. °· Your immobilizer gets damaged. °· You have worsening pain or swelling in your shoulder, arm, or hand. °· Your shoulder, arm, or hand changes color or temperature. °· You lose feeling in your shoulder, arm, or hand. °  °This information is not intended to replace advice given to you by your health care provider. Make sure you discuss any questions you have with your health care provider. °  °Document Released: 03/25/2004 Document Revised: 07/02/2014 Document Reviewed: 01/23/2014 °Elsevier Interactive Patient Education ©2016 Elsevier Inc. ° °
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
## 2015-06-02 NOTE — ED Provider Notes (Signed)
CSN: [id number]     Arrival date & time 06/01/15  2105 History   First MD Initiated Contact with Patient 06/01/15 2150     Chief Complaint  Patient presents with  . Shoulder Injury     (Consider location/radiation/quality/duration/timing/severity/associated sxs/prior Treatment) The history is provided by the patient.   Paul Dawson is a 40 y.o. male presenting from [facility] facility with complaint of right shoulder dislocation.  He has had multiple shoulder surgeries for rotator injury initially and then for revisions due to frequent dislocations, but her continues to have episodes if not careful.  He endorses reaching behind him with the extremity tonight while sitting on his bed causing dislocation. He can sometimes reduce it himself by hanging onto a bar and using his body weight but was too painful tonight to attempt this.  He denies weakness in his hand, endorses slight tingling sensation along with lateral hand and 5th finger.  He has had no treatments prior to arrival. He declines conscious sedation (see medication allergy list) and ketamine causes his PTSD to escalate.     Past Medical History  Diagnosis Date  . Shoulder dislocation   . Urinary hesitancy   . PTSD (post-traumatic stress disorder)    Past Surgical History  Procedure Laterality Date  . Shoulder surgery Right   . Appendectomy    . Tonsillectomy    . Thumb arthroscopy     History reviewed. No pertinent family history. Social History  Substance Use Topics  . Smoking status: Former Games developer  . Smokeless tobacco: None  . Alcohol Use: No    Review of Systems  Constitutional: Negative for fever.  Musculoskeletal: Positive for arthralgias. Negative for myalgias and joint swelling.  Neurological: Negative for weakness and numbness.      Allergies  Propofol; Latex; and Versed  Home Medications   Prior to Admission medications   Medication Sig Start Date End Date Taking? Authorizing  Provider  ibuprofen (ADVIL,MOTRIN) 800 MG tablet Take 1 tablet (800 mg total) by mouth every 8 (eight) hours as needed for mild pain. 04/09/15  Yes Kristen N Ward, DO  tamsulosin (FLOMAX) 0.4 MG CAPS capsule Take 0.4 mg by mouth daily.    Yes Historical Provider, MD  HYDROcodone-acetaminophen (NORCO/VICODIN) 5-325 MG tablet Take 1-2 tablets by mouth every 6 (six) hours as needed. Patient not taking: Reported on 06/01/2015 04/09/15   Kristen N Ward, DO   BP 112/79 mmHg  Pulse 74  Temp(Src) 97.9 F (36.6 C) (Oral)  Resp 18  Ht  (1.727 m)  Wt 73.483 kg  BMI 24.64 kg/m2  SpO2 98% Physical Exam  Constitutional: He appears well-developed and well-nourished.  HENT:  Head: Atraumatic.  Neck: Normal range of motion.  Cardiovascular:  Pulses:      Radial pulses are 2+ on the right side, and 2+ on the left side.  Pulses equal bilaterally  Musculoskeletal: He exhibits tenderness.       Right shoulder: He exhibits bony tenderness and deformity. He exhibits no spasm, normal pulse and normal strength.  Palpable anterior right shoulder dislocation/subluxation.  Neurological: He is alert. He has normal strength. He displays normal reflexes. No sensory deficit.  Equal grip strength.  Skin: Skin is warm and dry.  Psychiatric: He has a normal mood and affect.    ED Course  Procedures (including critical care time) Labs Review Labs Reviewed - No data to display  Imaging Review Dg Shoulder Right  06/02/2015  CLINICAL DATA:  Status post  reduction EXAM: RIGHT SHOULDER - 2+ VIEW COMPARISON:  Film from earlier in the same day FINDINGS: Humeral head is now well seated within the glenoid. Postsurgical changes are again identified. No acute fracture is noted. IMPRESSION: Relocation of the humeral head as described. Electronically Signed   By: Alcide CleverMark  Lukens M.D.   On: 06/02/2015 00:49   Dg Shoulder Right  06/01/2015  CLINICAL DATA:  Concern for right shoulder dislocation after reaching back with right arm.  History of multiple dislocations. Initial encounter. EXAM: RIGHT SHOULDER - 2+ VIEW COMPARISON:  Right shoulder radiographs performed 04/09/2015 FINDINGS: There is new anterior subluxation of the right humeral head. The humeral head is not definitely dislocated, though dislocation cannot be excluded given some degree of underlying glenoid deformity secondary to extensive prior glenoid surgery. The right acromioclavicular joint is unremarkable. The visualized portions of the right lung are grossly clear. IMPRESSION: New anterior subluxation of the right humeral head. The humeral head is not definitely dislocated, though dislocation cannot be excluded given some degree of underlying glenoid deformity secondary to extensive prior glenoid surgery. Would correlate with the patient's symptoms regarding the possibility of mild anterior dislocation. Electronically Signed   By: Roanna RaiderJeffery  Chang M.D.   On: 06/01/2015 22:04   I have personally reviewed and evaluated these images and lab results as part of my medical decision-making.   EKG Interpretation None      MDM   Final diagnoses:  Shoulder subluxation, right, initial encounter    Pt given dilaudid 1 mg IV, valium 2..5 mg, zofran.  No relief of pain, although endorses feels slightly more relaxed.  Given dilaudid 1 mg more, pain still 7/10, attempt to reduce using traction and countertraction not well tolerated. Pt holding arm in flexion, unable to relax his am.  Discussed with Dr. Hyacinth MeekerMiller, refer to his procedure note for reduction.  Post reduction films, humeral head relocated.  Sling and immobilizer provided.  Referral to ortho for f/u care.    Burgess AmorJulie Jermisha Hoffart, PA-C 06/02/15 1401  Eber HongBrian Miller, MD 06/02/15 [phone]

## 2015-10-16 IMAGING — DX DG SHOULDER 2+V*R*
2 series · 2 of 2 positions shown · non-contrast
Comparison: Earlier this day.  Radiographs 10/06/2014

CLINICAL DATA: Postreduction.

EXAM:
RIGHT SHOULDER - 2+ VIEW

[shoulder grashey]
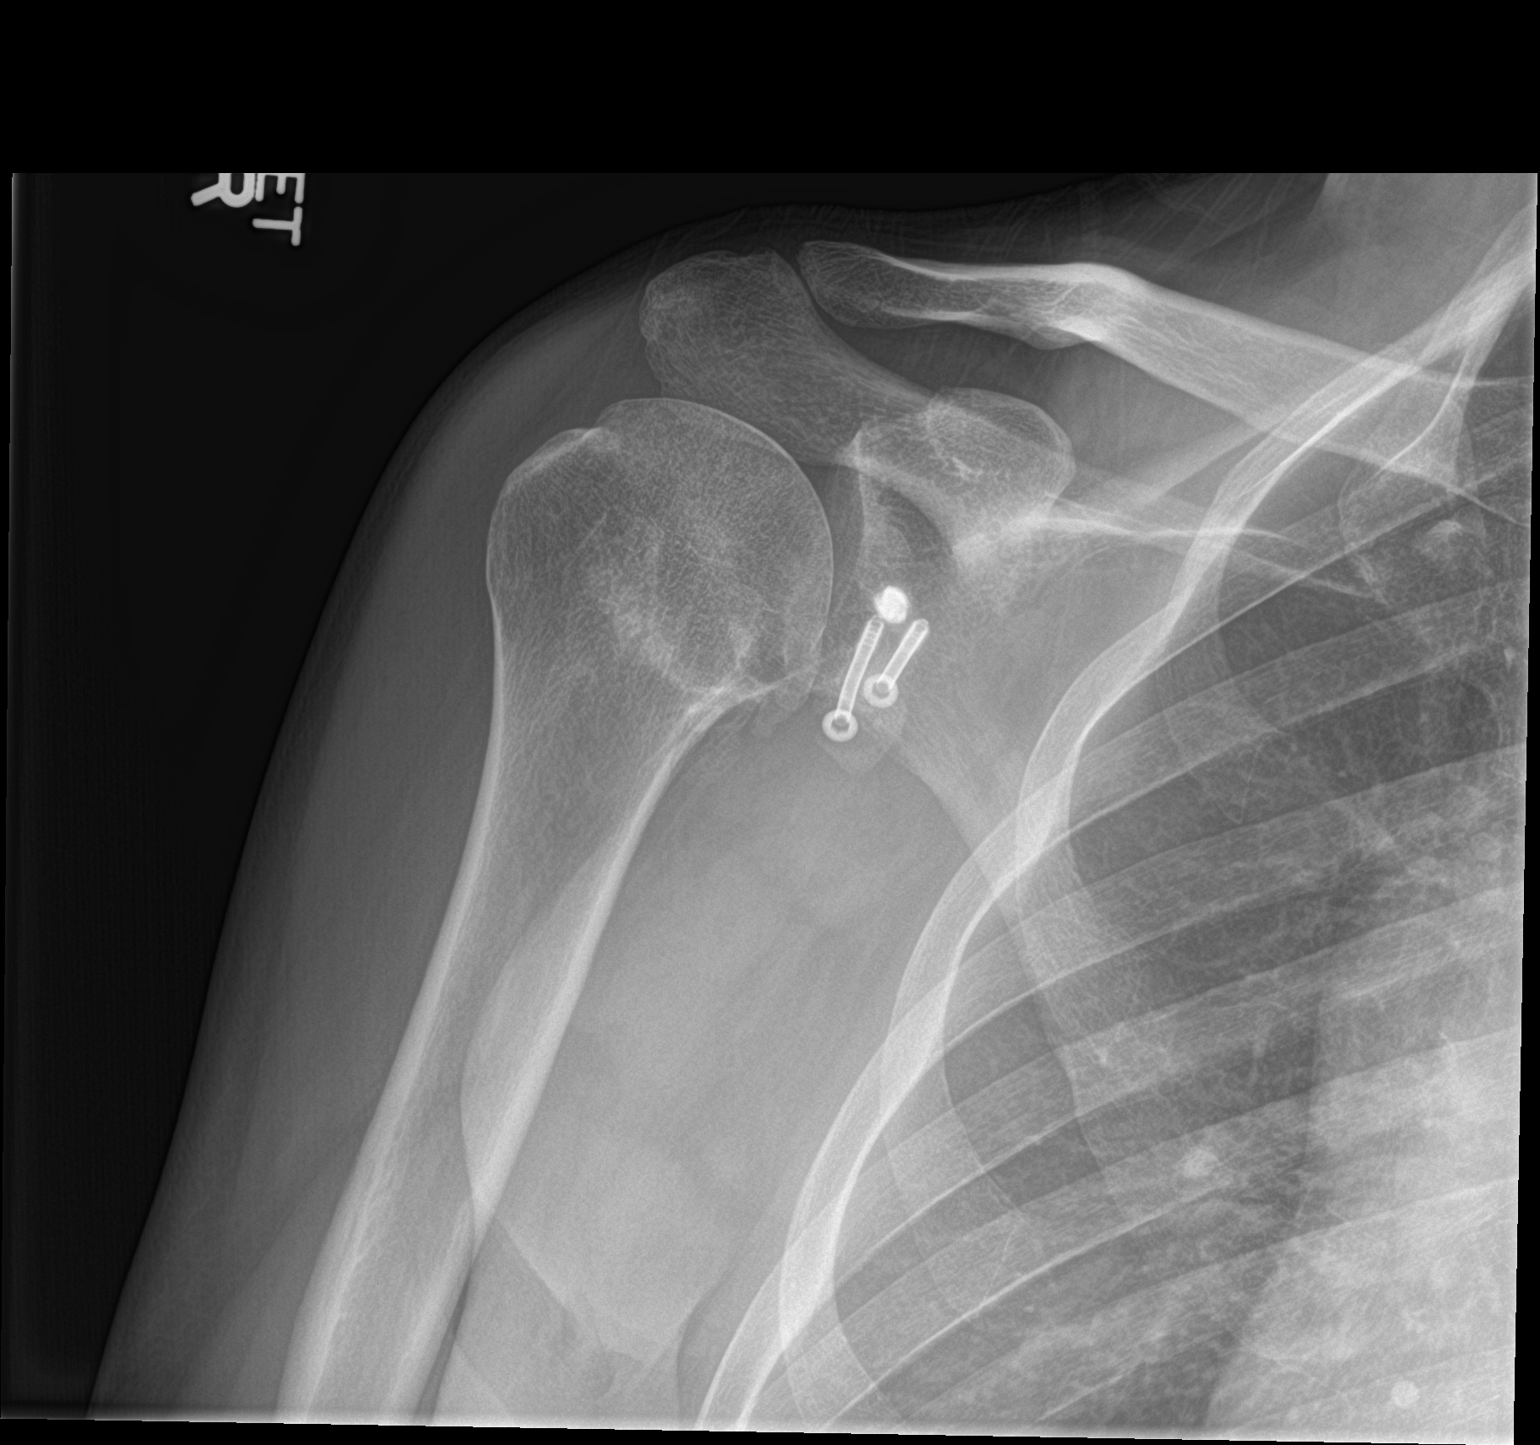

[shoulder y view]
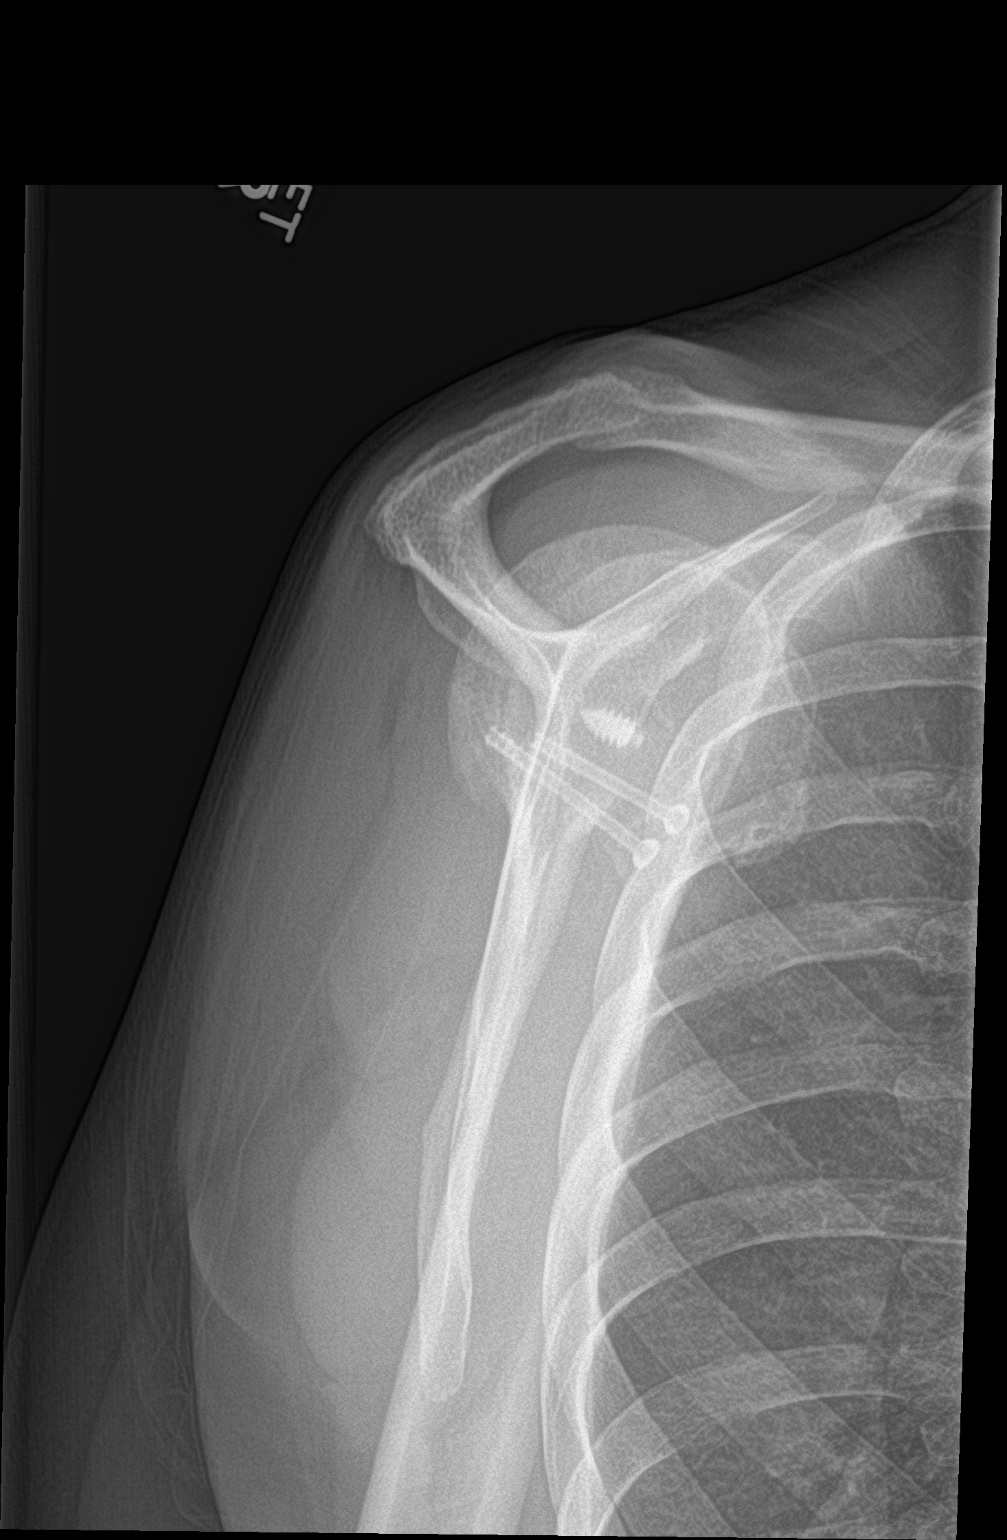

[2 of 2 positions shown; findings below may reference images not displayed]

FINDINGS: Humeral head normally persistent over the glenoid on the
transscapular Y-view. Two surgical screws and single anchor seen in
the inferior glenoid. Some adjacent fragmentation appears chronic.
Acromioclavicular joint is maintained.
IMPRESSION: Normal glenohumeral alignment, best appreciated on transscapular
Y-view. Postsurgical change of the inferior glenoid with adjacent
chronic fragmentation.

## 2015-12-23 DIAGNOSIS — M24411 Recurrent dislocation, right shoulder: Secondary | ICD-10-CM | POA: Insufficient documentation

## 2015-12-23 DIAGNOSIS — S4991XA Unspecified injury of right shoulder and upper arm, initial encounter: Secondary | ICD-10-CM | POA: Diagnosis present

## 2015-12-23 DIAGNOSIS — Y929 Unspecified place or not applicable: Secondary | ICD-10-CM | POA: Diagnosis not present

## 2015-12-23 DIAGNOSIS — Y939 Activity, unspecified: Secondary | ICD-10-CM | POA: Diagnosis not present

## 2015-12-23 DIAGNOSIS — X58XXXA Exposure to other specified factors, initial encounter: Secondary | ICD-10-CM | POA: Insufficient documentation

## 2015-12-23 DIAGNOSIS — Z791 Long term (current) use of non-steroidal anti-inflammatories (NSAID): Secondary | ICD-10-CM | POA: Insufficient documentation

## 2015-12-23 DIAGNOSIS — Y999 Unspecified external cause status: Secondary | ICD-10-CM | POA: Insufficient documentation

## 2015-12-23 DIAGNOSIS — Z87891 Personal history of nicotine dependence: Secondary | ICD-10-CM | POA: Diagnosis not present

## 2015-12-23 NOTE — ED Triage Notes (Signed)
Pt c/o pain to right shoulder; pt states he was in the bed and his shoulder got caught in the bed sheets and he is now having shoulder pain; pt states he has a hx of shoulder dislocation

## 2015-12-24 ENCOUNTER — Emergency Department (HOSPITAL_COMMUNITY)

## 2015-12-24 ENCOUNTER — Emergency Department (HOSPITAL_COMMUNITY)
Admission: EM | Admit: 2015-12-24 | Discharge: 2015-12-24 | Disposition: A | Discharge status: Home / Self Care | Attending: Emergency Medicine | Admitting: Emergency Medicine

## 2015-12-24 ENCOUNTER — Encounter (HOSPITAL_COMMUNITY): Payer: Self-pay | Admitting: *Deleted

## 2015-12-24 DIAGNOSIS — M24411 Recurrent dislocation, right shoulder: Secondary | ICD-10-CM

## 2015-12-24 MED ORDER — FENTANYL CITRATE (PF) 100 MCG/2ML IJ SOLN
50.0000 ug | Freq: Once | INTRAMUSCULAR | Status: AC
  Administered 2015-12-24: 50 ug via INTRAVENOUS

## 2015-12-24 MED ORDER — FENTANYL CITRATE (PF) 100 MCG/2ML IJ SOLN
INTRAMUSCULAR | Status: AC
  Administered 2015-12-24: 50 ug via INTRAVENOUS
  Filled 2015-12-24: qty 2

## 2015-12-24 MED ORDER — FENTANYL CITRATE (PF) 100 MCG/2ML IJ SOLN
50.0000 ug | Freq: Once | INTRAMUSCULAR | Status: AC
  Administered 2015-12-24: 50 ug via INTRAVENOUS
  Filled 2015-12-24: qty 2

## 2015-12-24 NOTE — ED Notes (Signed)
Assisted Dr. Preston FleetingGlick with putting the shoulder back in place.

## 2015-12-24 NOTE — ED Provider Notes (Signed)
[facility]-EMERGENCY DEPT Provider Note   CSN: [id number] Arrival date & time: 12/23/15  2336  By signing my name below, I, Paul Dawson, attest that this documentation has been prepared under the direction and in the presence of Paul Boozeavid Kia Varnadore, MD . Electronically Signed: Nelwyn SalisburyJoshua Dawson, Scribe. 12/24/2015. 12:50 AM.  History   Chief Complaint Chief Complaint  Patient presents with  . Shoulder Pain   The history is provided by the patient. No language interpreter was used.   HPI Comments:  Paul Dawson is a 40 y.o. male with pmhx of shoulder dislocations who presents to the Emergency Department complaining of sudden-onset unchanged constant right shoulder pain beginning earlier tonight. Pt states that he was getting ready for bed when his shoulder caught his bed sheet and pulled it. He notes that he has had this pain when his shoulders have dislocated in the past. Pt reports he has had multiple shoulder surgeries in the past and that his shoulder is generally weak. His pain is worsened on palpation and movement. No alleviating factors indicated. He denies any numbness or weakness.   Past Medical History:  Diagnosis Date  . PTSD (post-traumatic stress disorder)   . Shoulder dislocation   . Urinary hesitancy     There are no active problems to display for this patient.   Past Surgical History:  Procedure Laterality Date  . APPENDECTOMY    . SHOULDER SURGERY Right   . THUMB ARTHROSCOPY    . TONSILLECTOMY         Home Medications    Prior to Admission medications   Medication Sig Start Date End Date Taking? Authorizing Provider  ibuprofen (ADVIL,MOTRIN) 800 MG tablet Take 1 tablet (800 mg total) by mouth every 8 (eight) hours as needed for mild pain. 04/09/15  Yes Kristen N Ward, DO    Family History History reviewed. No pertinent family history.  Social History Social History  Substance Use Topics  . Smoking status: Former Games developermoker  . Smokeless tobacco: Never Used  .  Alcohol use No     Allergies   Propofol; Latex; Versed [midazolam]; and Tape   Review of Systems Review of Systems  Musculoskeletal: Positive for arthralgias and myalgias.  Neurological: Negative for weakness and numbness.  All other systems reviewed and are negative.    Physical Exam Updated Vital Signs BP 111/79 (BP Location: Left Arm)   Pulse 64   Temp 97.8 F (36.6 C) (Oral)   Resp 18   Ht 5\' 9"  (1.753 m)   Wt 165 lb (74.8 kg)   SpO2 100%   BMI 24.37 kg/m   Physical Exam  Constitutional: He is oriented to person, place, and time. He appears well-developed and well-nourished.  HENT:  Head: Normocephalic and atraumatic.  Eyes: EOM are normal. Pupils are equal, round, and reactive to light.  Neck: Normal range of motion. Neck supple. No JVD present.  Cardiovascular: Normal rate, regular rhythm and normal heart sounds.   No murmur heard. Pulmonary/Chest: Effort normal and breath sounds normal. He has no wheezes. He has no rales. He exhibits no tenderness.  Abdominal: Soft. Bowel sounds are normal. He exhibits no distension and no mass. There is no tenderness.  Musculoskeletal: Normal range of motion. He exhibits no edema.  Deformity of the right shoulder consistent with shoulder dislocation. Neurovascularly intact.   Lymphadenopathy:    He has no cervical adenopathy.  Neurological: He is alert and oriented to person, place, and time. No cranial nerve deficit. He exhibits normal muscle  tone. Coordination normal.  Skin: Skin is warm and dry. No rash noted.  Psychiatric: He has a normal mood and affect. His behavior is normal. Judgment and thought content normal.  Nursing note and vitals reviewed.    ED Treatments / Results  DIAGNOSTIC STUDIES:  Oxygen Saturation is 100% on RA, normal by my interpretation.    COORDINATION OF CARE:  12:51 AM Discussed treatment plan with pt at bedside which includes reduction of the joint and pt agreed to plan.   Radiology Dg  Shoulder Right  Result Date: 12/24/2015 CLINICAL DATA:  40 year old male with trauma to the right upper extremity and right shoulder pain. EXAM: RIGHT SHOULDER - 2+ VIEW COMPARISON:  Right shoulder radiograph dated 06/02/2015 FINDINGS: There is slight anterior positioning of the humerus in relation to the glenoid on the lateral Y-view projection, similar to the radiograph dated 06/01/2015. This may represent a degree of subluxation. Correlation with clinical exam recommended to evaluate for possibility of anterior shoulder dislocation. No acute fracture identified. The bones are osteopenic. There is postsurgical changes of the glenoid with multiple screws and chronic deformity of the inferior aspect of the glenoid. The soft tissues appear unremarkable. IMPRESSION: Apparent anterior positioning of the humeral head in relation to the glenoid on the lateral projection concerning for a degree of anterior subluxation. Correlation with clinical exam recommended. No acute fracture. Electronically Signed   By: Elgie Collard M.D.   On: 12/24/2015 01:50    Procedures Procedures (including critical care time) Reduction of dislocation Date/Time: 2:32 AM Performed by: [email or web address] Authorized by: [email or web address] Consent: Verbal consent obtained. Risks and benefits: risks, benefits and alternatives were discussed Consent given by: patient Required items: required blood products, implants, devices, and special equipment available Time out: Immediately prior to procedure a "time out" was called to verify the correct patient, procedure, equipment, support staff and site/side marked as required.  Patient sedated: no  Vitals: Vital signs were monitored during sedation. Patient tolerance: Patient tolerated the procedure well with no immediate complications. Joint: right shoulder Reduction technique: traction/counter-traction, manipulation Postreduction x-ray obtained confirming adequate reduction.      Medications Ordered in ED Medications - No data to display   Initial Impression / Assessment and Plan / ED Course  I have reviewed the triage vital signs and the nursing notes.  Pertinent imaging results that were available during my care of the patient were reviewed by me and considered in my medical decision making (see chart for details).  Clinical Course   Recurrent right shoulder dislocation. Exam is consistent with shoulder dislocation. Patient relates adverse reactions to propofol and ketamine. Shoulder is reduced using fentanyl for pain control.  Final Clinical Impressions(s) / ED Diagnoses   Final diagnoses:  Recurrent dislocation, right shoulder    New Prescriptions New Prescriptions   No medications on file  I personally performed the services described in this documentation, which was scribed in my presence. The recorded information has been reviewed and is accurate.       Paul Booze, MD 12/24/15 [phone]

## 2016-11-24 IMAGING — CR DG SHOULDER 2+V*R*
2 series · 2 of 2 positions shown · non-contrast
Comparison: 12/24/2015

CLINICAL DATA: Post reduction

EXAM:
RIGHT SHOULDER - 2+ VIEW

[ap]
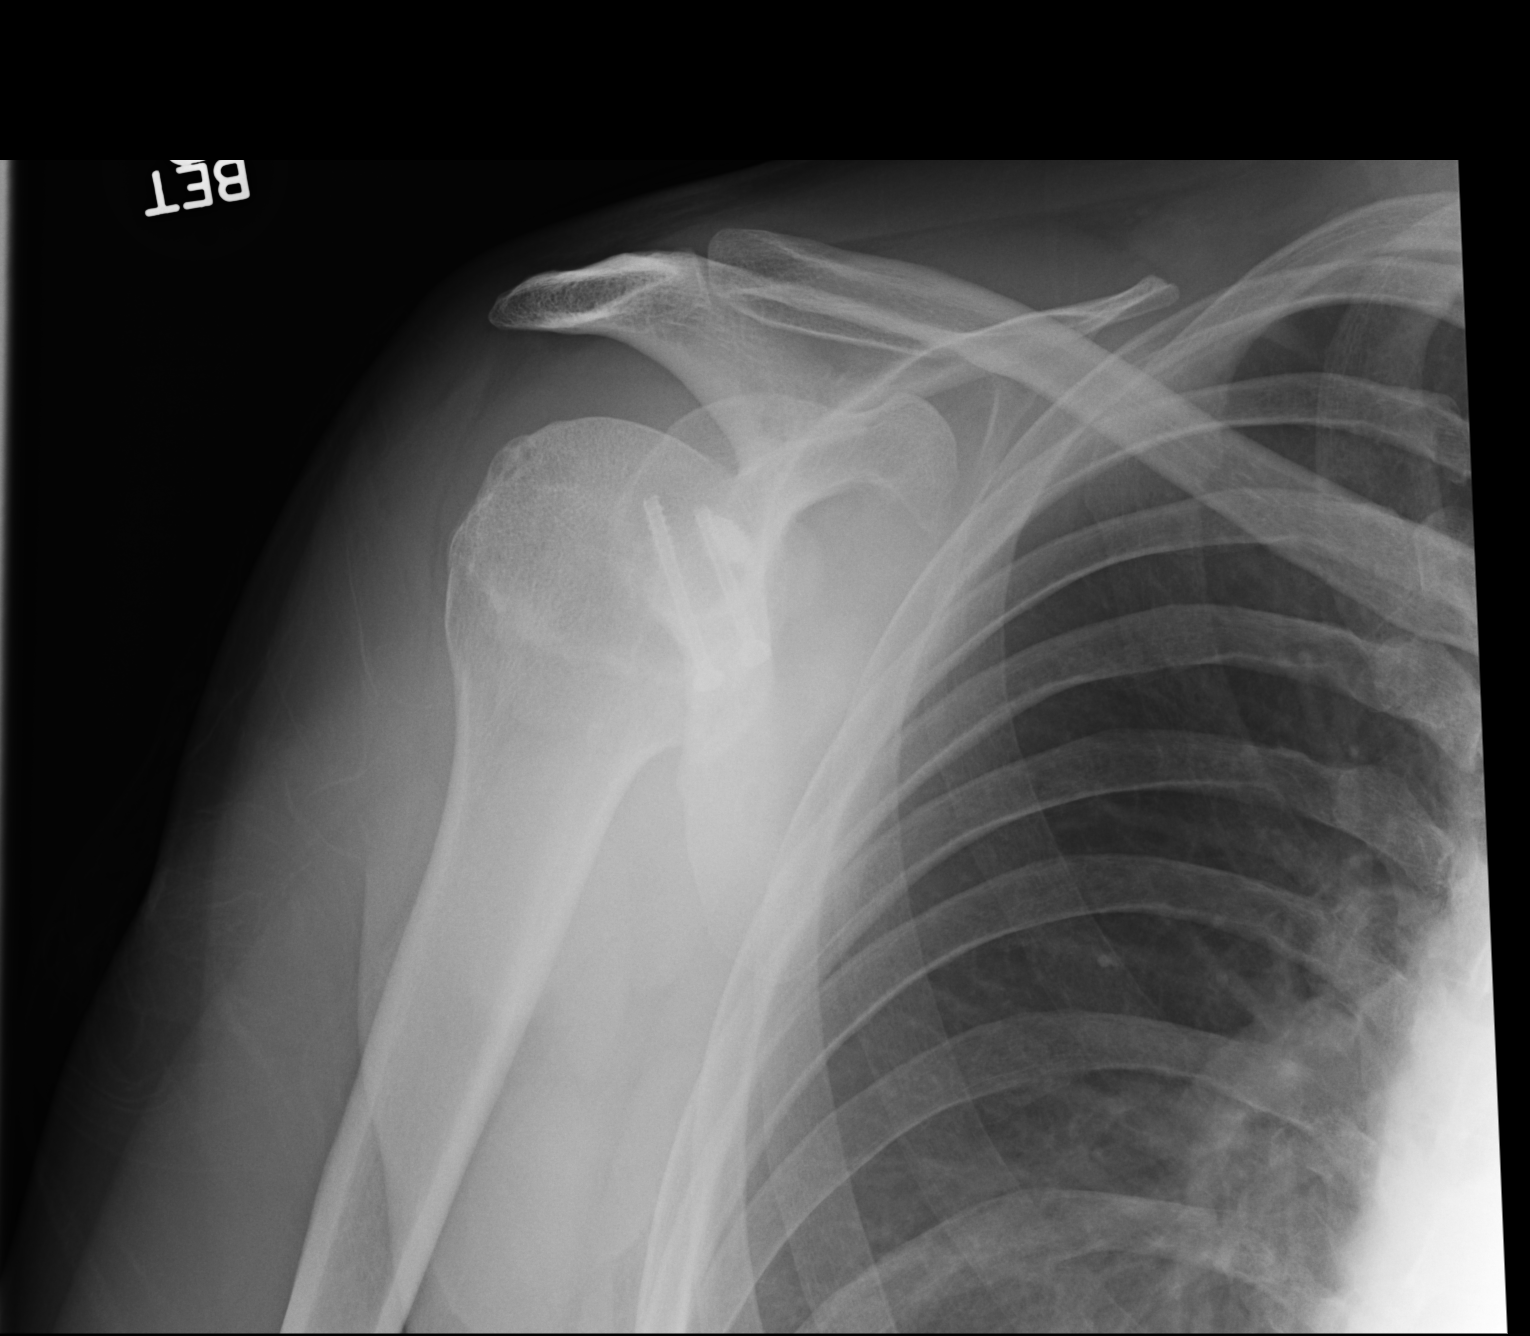

[scapula y]
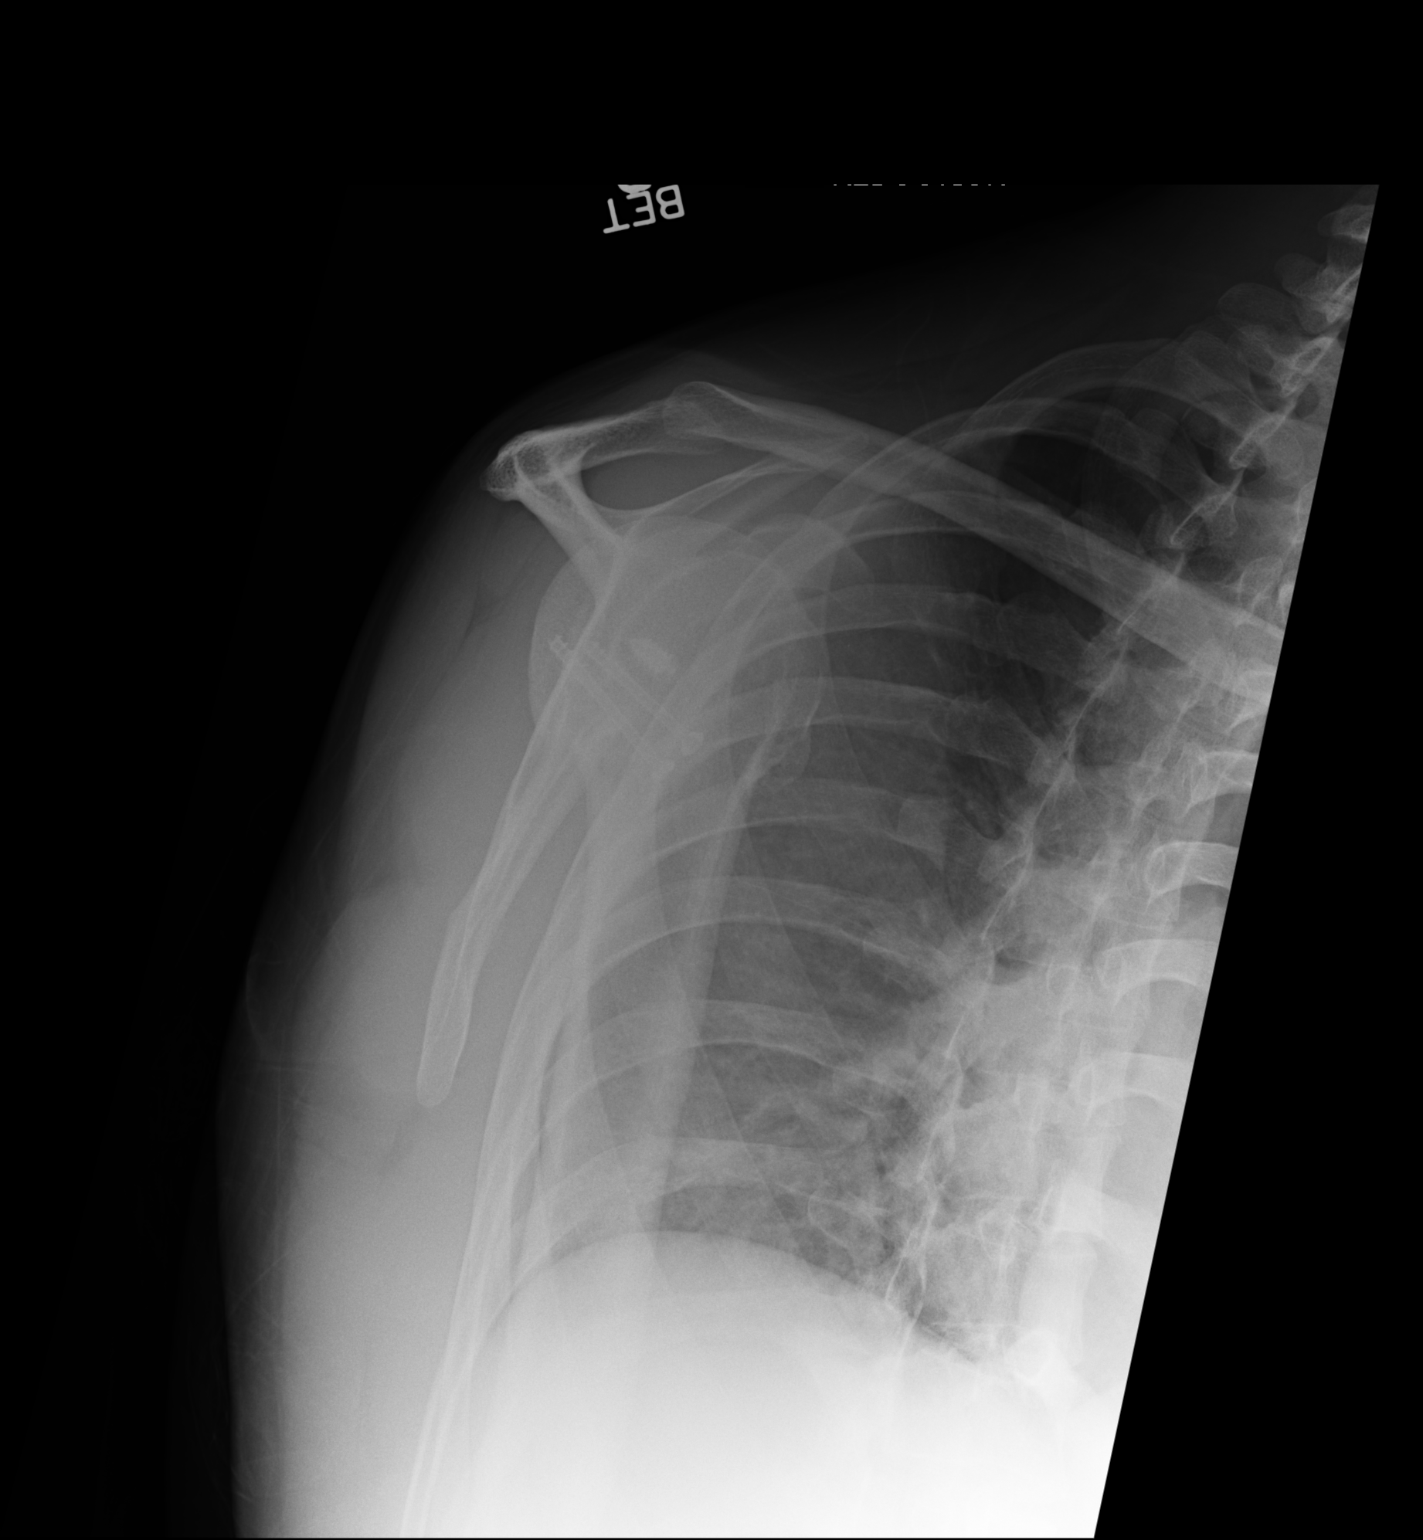

[2 of 2 positions shown; findings below may reference images not displayed]

FINDINGS: Reduction of right anterior dislocation with more of the humeral
head positioned over the glenoid. Surgical screws are present in the
glenoid with glenoid deformity. No acute fracture. Right lung apex
clear.
IMPRESSION: Reduction of anteriorly subluxed humeral head with more of the
humeral head projecting over the glenoid fossa.
